# Patient Record
Sex: Female | Born: 1980 | Race: Black or African American | Hispanic: No | Marital: Single | Smoking: Current every day smoker
Health system: Southern US, Community
[De-identification: ages and names within clinical notes are randomized; demographics above are authoritative.]

---

## 2013-12-30 ENCOUNTER — Emergency Department (HOSPITAL_COMMUNITY)
Admission: EM | Admit: 2013-12-30 | Discharge: 2013-12-30 | Discharge status: Against Medical Advice | Payer: No Typology Code available for payment source | Attending: Emergency Medicine | Admitting: Emergency Medicine

## 2013-12-30 ENCOUNTER — Encounter (HOSPITAL_COMMUNITY): Payer: Self-pay | Admitting: Emergency Medicine

## 2013-12-30 DIAGNOSIS — Z79899 Other long term (current) drug therapy: Secondary | ICD-10-CM | POA: Insufficient documentation

## 2013-12-30 DIAGNOSIS — S3981XA Other specified injuries of abdomen, initial encounter: Secondary | ICD-10-CM | POA: Diagnosis not present

## 2013-12-30 DIAGNOSIS — M545 Low back pain, unspecified: Secondary | ICD-10-CM

## 2013-12-30 DIAGNOSIS — IMO0002 Reserved for concepts with insufficient information to code with codable children: Secondary | ICD-10-CM | POA: Insufficient documentation

## 2013-12-30 DIAGNOSIS — Z3202 Encounter for pregnancy test, result negative: Secondary | ICD-10-CM | POA: Insufficient documentation

## 2013-12-30 DIAGNOSIS — F17200 Nicotine dependence, unspecified, uncomplicated: Secondary | ICD-10-CM | POA: Diagnosis not present

## 2013-12-30 DIAGNOSIS — S0990XA Unspecified injury of head, initial encounter: Secondary | ICD-10-CM | POA: Diagnosis not present

## 2013-12-30 DIAGNOSIS — Y9389 Activity, other specified: Secondary | ICD-10-CM | POA: Insufficient documentation

## 2013-12-30 DIAGNOSIS — R109 Unspecified abdominal pain: Secondary | ICD-10-CM

## 2013-12-30 DIAGNOSIS — Y92410 Unspecified street and highway as the place of occurrence of the external cause: Secondary | ICD-10-CM | POA: Diagnosis not present

## 2013-12-30 LAB — CBC
HEMATOCRIT: 34.7 % — AB (ref 36.0–46.0)
Hemoglobin: 11 g/dL — ABNORMAL LOW (ref 12.0–15.0)
MCH: 25.2 pg — ABNORMAL LOW (ref 26.0–34.0)
MCHC: 31.7 g/dL (ref 30.0–36.0)
MCV: 79.4 fL (ref 78.0–100.0)
Platelets: 540 10*3/uL — ABNORMAL HIGH (ref 150–400)
RBC: 4.37 MIL/uL (ref 3.87–5.11)
RDW: 13.3 % (ref 11.5–15.5)
WBC: 9.3 10*3/uL (ref 4.0–10.5)

## 2013-12-30 LAB — I-STAT CHEM 8, ED
BUN: 6 mg/dL (ref 6–23)
Calcium, Ion: 1.16 mmol/L (ref 1.12–1.23)
Chloride: 103 mEq/L (ref 96–112)
Creatinine, Ser: 0.7 mg/dL (ref 0.50–1.10)
Glucose, Bld: 88 mg/dL (ref 70–99)
HCT: 39 % (ref 36.0–46.0)
Hemoglobin: 13.3 g/dL (ref 12.0–15.0)
Potassium: 3.5 mEq/L — ABNORMAL LOW (ref 3.7–5.3)
Sodium: 139 mEq/L (ref 137–147)
TCO2: 23 mmol/L (ref 0–100)

## 2013-12-30 LAB — POC URINE PREG, ED: PREG TEST UR: NEGATIVE

## 2013-12-30 MED ORDER — IBUPROFEN 800 MG PO TABS: 800.0000 mg | ORAL_TABLET | Freq: Once | ORAL | Status: DC

## 2013-12-30 NOTE — ED Notes (Signed)
PT moved to room B-16 for higher acuity . Pt reported ABD pain to PA-C.
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
## 2013-12-30 NOTE — ED Notes (Signed)
IV team paged for IV start. 

## 2013-12-30 NOTE — ED Notes (Signed)
Iv nurse paged by Magda PaganiniAudrey, RN.  She will be down to assess pt. Spoke with pt. About plan of care. Pt. Verbalized understanding
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
## 2013-12-30 NOTE — ED Notes (Signed)
Pt was offered ultrasound IV start via AD Zach. When RN returned to room with Ultrasound machine pt was upset refused IV start; states she is going to leave and will wait for AD to return from speaking with PA; Pt was very rude and disrespectful to staff; RN tried to explain reason for delay but pt was verbally rude; [facility]

## 2013-12-30 NOTE — ED Notes (Signed)
Pt now reports ABD pain. Ct abd orderd.

## 2013-12-30 NOTE — ED Notes (Signed)
IV team contacted. 

## 2013-12-31 NOTE — ED Provider Notes (Signed)
Medical screening examination/treatment/procedure(s) were performed by non-physician practitioner and as supervising physician I was immediately available for consultation/collaboration.   EKG Interpretation None        Gaylin Bulthuis T Britnay Magnussen, MD 12/31/13 0048 

## 2014-01-01 NOTE — ED Provider Notes (Signed)
Medical screening examination/treatment/procedure(s) were performed by non-physician practitioner and as supervising physician I was immediately available for consultation/collaboration.  Audree CamelScott T Tustin Wenzlick, MD 01/01/14 0700

## 2014-01-04 ENCOUNTER — Emergency Department (HOSPITAL_COMMUNITY)
Admission: EM | Admit: 2014-01-04 | Discharge: 2014-01-04 | Disposition: A | Discharge status: Home / Self Care | Payer: No Typology Code available for payment source | Attending: Emergency Medicine | Admitting: Emergency Medicine

## 2014-01-04 ENCOUNTER — Emergency Department (HOSPITAL_COMMUNITY): Payer: No Typology Code available for payment source

## 2014-01-04 ENCOUNTER — Encounter (HOSPITAL_COMMUNITY): Payer: Self-pay | Admitting: Emergency Medicine

## 2014-01-04 DIAGNOSIS — Z720 Tobacco use: Secondary | ICD-10-CM | POA: Insufficient documentation

## 2014-01-04 DIAGNOSIS — Y9389 Activity, other specified: Secondary | ICD-10-CM | POA: Insufficient documentation

## 2014-01-04 DIAGNOSIS — S3992XA Unspecified injury of lower back, initial encounter: Secondary | ICD-10-CM | POA: Diagnosis present

## 2014-01-04 DIAGNOSIS — Y92410 Unspecified street and highway as the place of occurrence of the external cause: Secondary | ICD-10-CM | POA: Diagnosis not present

## 2014-01-04 DIAGNOSIS — Z79899 Other long term (current) drug therapy: Secondary | ICD-10-CM | POA: Diagnosis not present

## 2014-01-04 DIAGNOSIS — S79911A Unspecified injury of right hip, initial encounter: Secondary | ICD-10-CM | POA: Diagnosis not present

## 2014-01-04 DIAGNOSIS — M549 Dorsalgia, unspecified: Secondary | ICD-10-CM

## 2014-01-04 DIAGNOSIS — M25551 Pain in right hip: Secondary | ICD-10-CM

## 2014-01-04 MED ORDER — HYDROCODONE-ACETAMINOPHEN 5-325 MG PO TABS: 1.0000 | ORAL_TABLET | ORAL | Status: AC | PRN

## 2014-01-04 MED ORDER — METHOCARBAMOL 500 MG PO TABS: 500.0000 mg | ORAL_TABLET | Freq: Two times a day (BID) | ORAL | Status: AC
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
## 2014-01-04 NOTE — Discharge Instructions (Signed)
Take the prescribed medication as directed. °Return to the ED for new or worsening symptoms. ° °
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
## 2014-01-05 NOTE — ED Provider Notes (Signed)
Medical screening examination/treatment/procedure(s) were performed by non-physician practitioner and as supervising physician I was immediately available for consultation/collaboration.   Gilda Creasehristopher J. Ysabel Cowgill, MD 01/05/14 [phone]

## 2014-02-03 ENCOUNTER — Encounter (HOSPITAL_COMMUNITY): Payer: Self-pay | Admitting: Emergency Medicine

## 2014-02-03 ENCOUNTER — Emergency Department (HOSPITAL_COMMUNITY)
Admission: EM | Admit: 2014-02-03 | Discharge: 2014-02-03 | Disposition: A | Discharge status: Home / Self Care | Payer: Medicaid Other | Attending: Emergency Medicine | Admitting: Emergency Medicine

## 2014-02-03 ENCOUNTER — Emergency Department (HOSPITAL_COMMUNITY): Payer: Medicaid Other

## 2014-02-03 DIAGNOSIS — Z79899 Other long term (current) drug therapy: Secondary | ICD-10-CM | POA: Diagnosis not present

## 2014-02-03 DIAGNOSIS — R51 Headache: Secondary | ICD-10-CM | POA: Insufficient documentation

## 2014-02-03 DIAGNOSIS — H109 Unspecified conjunctivitis: Secondary | ICD-10-CM | POA: Insufficient documentation

## 2014-02-03 DIAGNOSIS — Z720 Tobacco use: Secondary | ICD-10-CM | POA: Diagnosis not present

## 2014-02-03 DIAGNOSIS — H5711 Ocular pain, right eye: Secondary | ICD-10-CM | POA: Diagnosis present

## 2014-02-03 MED ORDER — ERYTHROMYCIN 5 MG/GM OP OINT
TOPICAL_OINTMENT | Freq: Once | OPHTHALMIC | Status: AC
  Administered 2014-02-03: 21:00:00 via OPHTHALMIC
  Filled 2014-02-03: qty 3.5

## 2014-02-03 NOTE — ED Notes (Signed)
Pt states she woke up w/ her R eye swollen, draining and painful. Alert and oriented.

## 2014-02-03 NOTE — ED Provider Notes (Signed)
CSN: [id number]     Arrival date & time 02/03/14  1948 History   None    Chief Complaint  Patient presents with  . Eye Pain   Patient is a 33 y.o. female presenting with eye pain. The history is provided by the patient. No language interpreter was used.  Eye Pain This is a new problem. The current episode started yesterday. The problem occurs constantly. Associated symptoms include headaches.   This chart was scribed for non-physician practitioner, Elpidio AnisShari Alexsander Cavins PA-C working with No att. providers found, by Andrew Auaven Small, ED Scribe. This patient was seen in room WTR8/WTR8 and the patient's care was started at 8:05 PM.  Ruth Hayes is a 33 y.o. female who presents to the Emergency Department complaining of right eye pain with  that began 1 day ago upon waking. Pt states she woke up yesterday with right eye matted shut followed by right eye pain. She reports associated photophobia, HA, swelling, drainage and straining to right eye. Pt denies recent injury. She denies left eye pain. She denies fevers. She denies hx of DM.    History reviewed. No pertinent past medical history. History reviewed. No pertinent past surgical history. History reviewed. No pertinent family history. History  Substance Use Topics  . Smoking status: Current Every Day Smoker  . Smokeless tobacco: Not on file  . Alcohol Use: No   OB History    No data available     Review of Systems  Constitutional: Negative for fever and chills.  Eyes: Positive for photophobia, pain, discharge and redness.  Neurological: Positive for headaches.   Allergies  Review of patient's allergies indicates no known allergies.  Home Medications   Prior to Admission medications   Medication Sig Start Date End Date Taking? Authorizing Provider  HYDROcodone-acetaminophen (NORCO/VICODIN) 5-325 MG per tablet Take 1 tablet by mouth every 4 (four) hours as needed. 01/04/14   Garlon HatchetLisa M Sanders, PA-C  methocarbamol (ROBAXIN) 500 MG tablet Take 1  tablet (500 mg total) by mouth 2 (two) times daily. 01/04/14   Garlon HatchetLisa M Sanders, PA-C  venlafaxine (EFFEXOR) 100 MG tablet Take 100 mg by mouth 2 (two) times daily.    Historical Provider, MD   BP 125/106 mmHg  Pulse 99  Temp(Src) 99.4 F (37.4 C) (Oral)  SpO2 98%  LMP 01/18/2014 Physical Exam  Constitutional: She is oriented to person, place, and time. She appears well-developed and well-nourished. No distress.  HENT:  Head: Normocephalic and atraumatic.  Eyes: EOM are normal. Right conjunctiva is injected.  Right eye inflamed, conjunctiva cornea clear. Painful but full range of EOM. No eye lid swelling or erythema.   Neck: Neck supple.  Cardiovascular: Normal rate.   Pulmonary/Chest: Effort normal.  Musculoskeletal: Normal range of motion.  Neurological: She is alert and oriented to person, place, and time.  Skin: Skin is warm and dry.  Psychiatric: She has a normal mood and affect. Her behavior is normal.  Nursing note and vitals reviewed.  ED Course  Procedures  DIAGNOSTIC STUDIES: Oxygen Saturation is 98% on RA, normal by my interpretation.    COORDINATION OF CARE: 8:08 PM- Pt advised of plan for treatment and pt agrees.  Labs Review Labs Reviewed - No data to display  Imaging Review No results found.   EKG Interpretation None      MDM   Final diagnoses:  None   1. Right eye conjunctivitis  CT scan was ordered to evaluate for cellulitis given low grade temperature and degree of  patient's eye pain. She did not want to wait until CT scan could be performed stating she had children at home she needed to get home to. She was told she could return at any time for further evaluation and cautioned to return with any fever, increasing pain, eye lid swelling or redness, or new symptoms of concern.   I personally performed the services described in this documentation, which was scribed in my presence. The recorded information has been reviewed and is  accurate.     Arnoldo HookerShari A Laure Leone, PA-C 02/03/14 2213  Ethelda ChickMartha K Linker, MD 02/03/14 2215

## 2014-02-03 NOTE — ED Notes (Signed)
Pt refused to wait to have CT scan. IV removed.

## 2014-02-03 NOTE — Discharge Instructions (Signed)

## 2014-06-14 ENCOUNTER — Emergency Department (HOSPITAL_COMMUNITY)
Admission: EM | Admit: 2014-06-14 | Discharge: 2014-06-14 | Discharge status: Against Medical Advice | Payer: Medicaid Other | Attending: Emergency Medicine | Admitting: Emergency Medicine

## 2014-06-14 ENCOUNTER — Encounter (HOSPITAL_COMMUNITY): Payer: Self-pay | Admitting: Emergency Medicine

## 2014-06-14 DIAGNOSIS — O209 Hemorrhage in early pregnancy, unspecified: Secondary | ICD-10-CM | POA: Diagnosis present

## 2014-06-14 DIAGNOSIS — F17299 Nicotine dependence, other tobacco product, with unspecified nicotine-induced disorders: Secondary | ICD-10-CM | POA: Diagnosis not present

## 2014-06-14 DIAGNOSIS — O99331 Smoking (tobacco) complicating pregnancy, first trimester: Secondary | ICD-10-CM | POA: Diagnosis not present

## 2014-06-14 DIAGNOSIS — Z3A01 Less than 8 weeks gestation of pregnancy: Secondary | ICD-10-CM | POA: Insufficient documentation

## 2014-06-14 LAB — URINE MICROSCOPIC-ADD ON

## 2014-06-14 LAB — ABO/RH: ABO/RH(D): B POS

## 2014-06-14 LAB — I-STAT BETA HCG BLOOD, ED (MC, WL, AP ONLY): I-stat hCG, quantitative: >2000 m[IU]/mL — ABNORMAL HIGH (ref <5)

## 2014-06-14 LAB — HCG, QUANTITATIVE, PREGNANCY: hCG, Beta Chain, Quant, S: 78986 m[IU]/mL — ABNORMAL HIGH (ref <5)

## 2014-06-14 LAB — URINALYSIS, ROUTINE W REFLEX MICROSCOPIC
Bilirubin Urine: NEGATIVE
Glucose, UA: NEGATIVE mg/dL
KETONES UR: NEGATIVE mg/dL
NITRITE: NEGATIVE
PH: 6 (ref 5.0–8.0)
Protein, ur: NEGATIVE mg/dL
Specific Gravity, Urine: 1.021 (ref 1.005–1.030)
Urobilinogen, UA: 1 mg/dL (ref 0.0–1.0)

## 2014-06-14 NOTE — ED Notes (Signed)
Pt sts approx [redacted] weeks pregnant and is having vaginal bleeding today; pt denies pain but sts some pressure; pt sts LMP was 04/16/2014; pt sts seen at PCP but no OB appt yet for this pregnancy; pt G5P4

## 2014-06-14 NOTE — ED Notes (Signed)
Pt called in waiting room x1, no answer 

## 2014-06-14 NOTE — ED Notes (Signed)
No answer in lobby again.
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
